# Patient Record
Sex: Female | Born: 1960 | Race: Black or African American | Hispanic: No | Smoking: Former smoker
Health system: Southern US, Community
[De-identification: ages and names within clinical notes are randomized; demographics above are authoritative.]

## PROBLEM LIST (undated history)

## (undated) DIAGNOSIS — F32A Depression, unspecified: Secondary | ICD-10-CM

## (undated) DIAGNOSIS — N289 Disorder of kidney and ureter, unspecified: Secondary | ICD-10-CM

## (undated) DIAGNOSIS — I10 Essential (primary) hypertension: Secondary | ICD-10-CM

## (undated) DIAGNOSIS — F329 Major depressive disorder, single episode, unspecified: Secondary | ICD-10-CM

## (undated) HISTORY — PX: ABDOMINAL HYSTERECTOMY: SHX81

---

## 2004-04-25 ENCOUNTER — Emergency Department (HOSPITAL_COMMUNITY): Admission: EM | Admit: 2004-04-25 | Discharge: 2004-04-26 | Payer: Self-pay | Admitting: *Deleted

## 2004-08-15 ENCOUNTER — Ambulatory Visit: Payer: Self-pay | Admitting: Family Medicine

## 2004-10-08 ENCOUNTER — Ambulatory Visit: Payer: Self-pay | Admitting: *Deleted

## 2004-10-08 ENCOUNTER — Ambulatory Visit: Payer: Self-pay | Admitting: Family Medicine

## 2004-12-03 ENCOUNTER — Ambulatory Visit: Payer: Self-pay | Admitting: Family Medicine

## 2004-12-11 ENCOUNTER — Ambulatory Visit (HOSPITAL_COMMUNITY): Admission: RE | Admit: 2004-12-11 | Discharge: 2004-12-11 | Payer: Self-pay | Admitting: Family Medicine

## 2005-03-05 ENCOUNTER — Ambulatory Visit: Payer: Self-pay | Admitting: Family Medicine

## 2005-03-12 ENCOUNTER — Ambulatory Visit (HOSPITAL_COMMUNITY): Admission: RE | Admit: 2005-03-12 | Discharge: 2005-03-12 | Payer: Self-pay | Admitting: Family Medicine

## 2005-04-19 ENCOUNTER — Ambulatory Visit: Payer: Self-pay | Admitting: Family Medicine

## 2005-05-13 ENCOUNTER — Ambulatory Visit: Payer: Self-pay | Admitting: Family Medicine

## 2006-04-18 IMAGING — CR DG FOOT COMPLETE 3+V*L*
2 series · 2 of 2 positions shown · non-contrast
Comparison: none

CLINICAL DATA: 43-year-old female ? trauma, tripping injury.  
 LEFT FOOT (THREE VIEWS) 04/26/04

[view not recorded (1 of 2)]
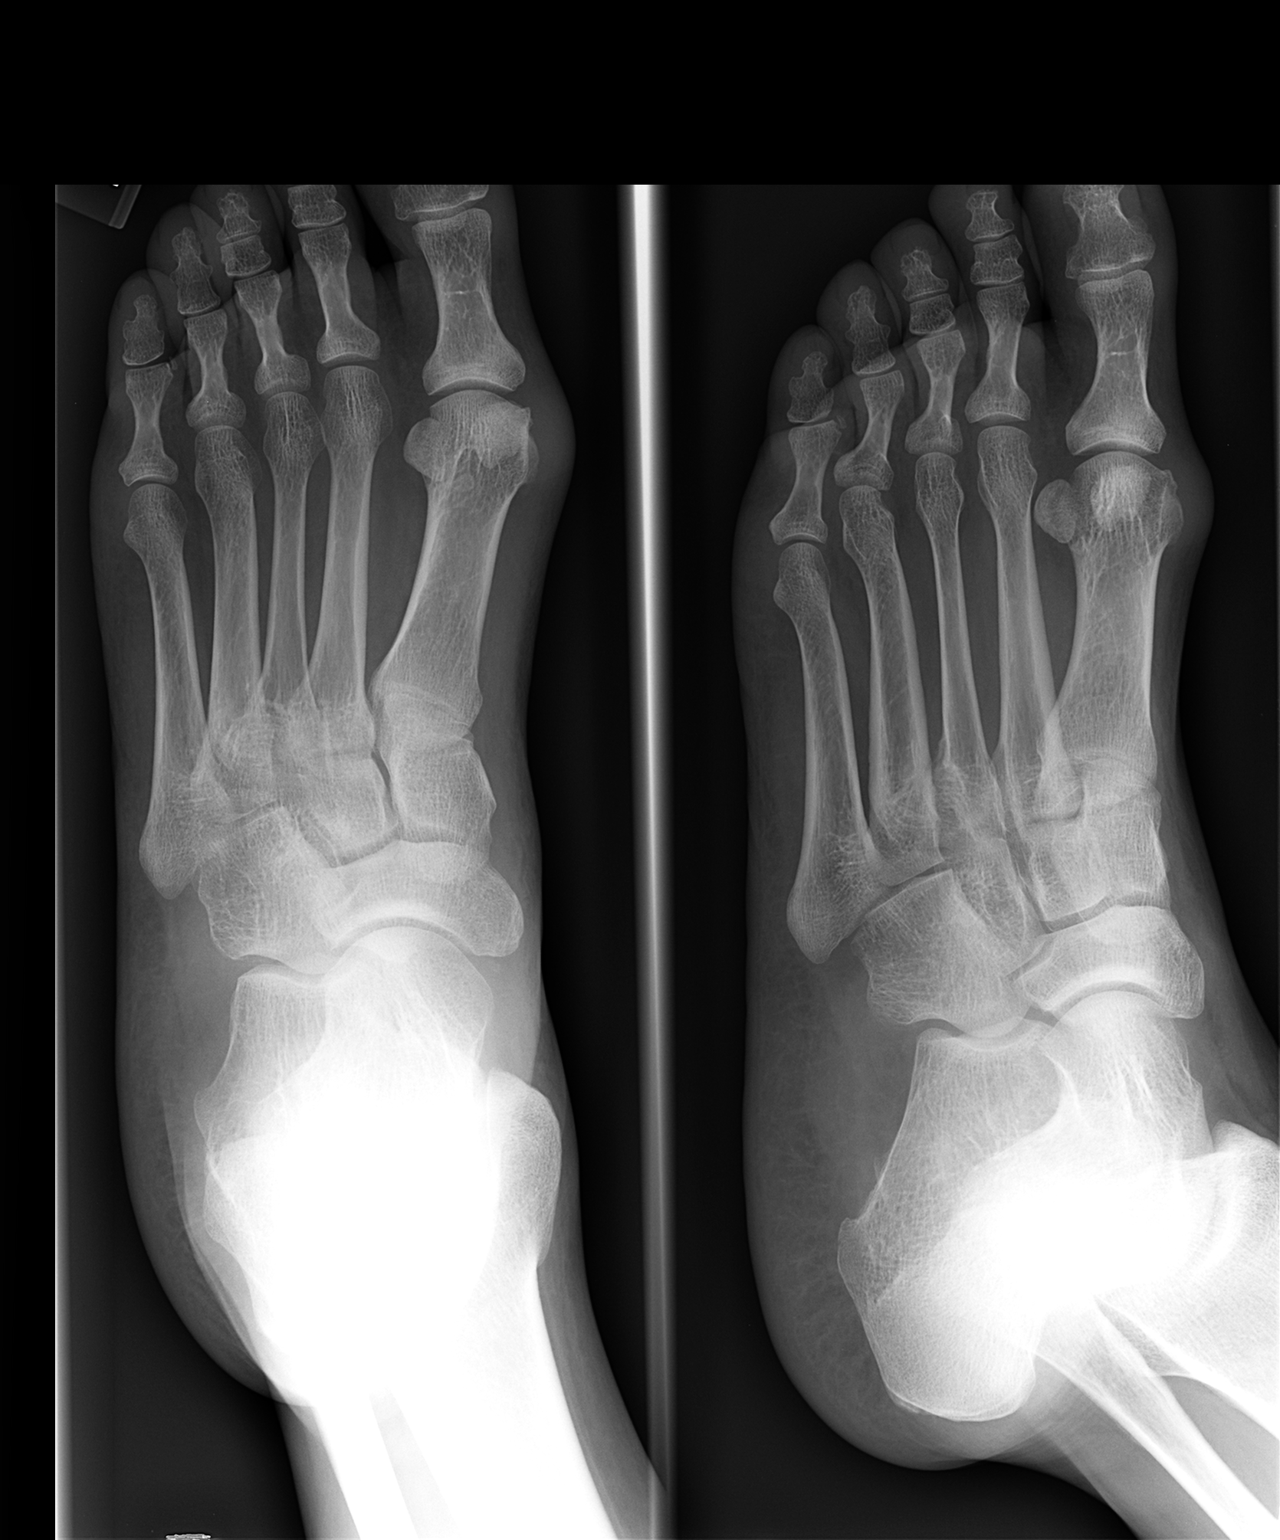

[view not recorded (2 of 2)]
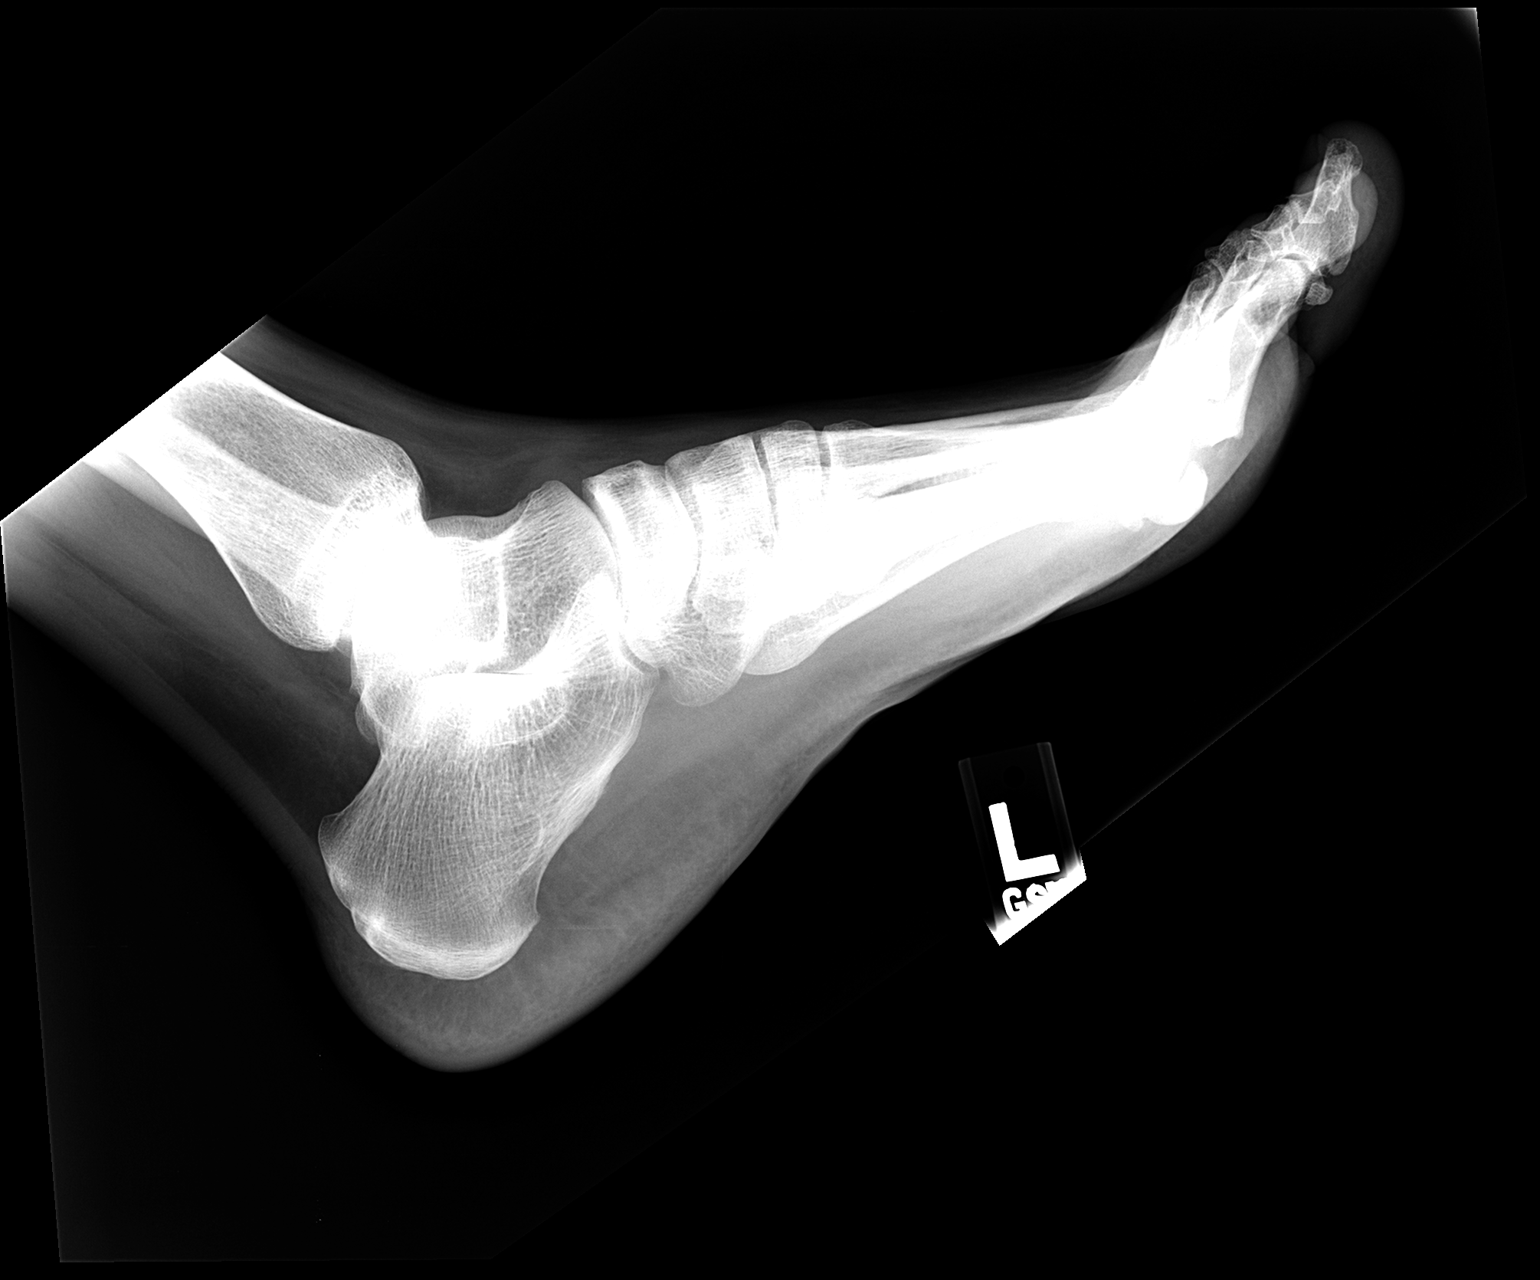

[2 of 2 positions shown; findings below may reference images not displayed]

FINDINGS: Bone density is normal.  No acute fracture or malalignment.  Small secondary ossicle is evident of the left fifth toe interphalangeal joint.  
 IMPRESSION
 No acute finding.

## 2007-03-04 IMAGING — CR DG CHEST 2V
2 series · 2 of 2 positions shown · non-contrast
Comparison: None.

CLINICAL DATA: Smoker of 30 years; tightness in chest; asthma. 
 2-VIEW CHEST:

[view not recorded (1 of 2)]
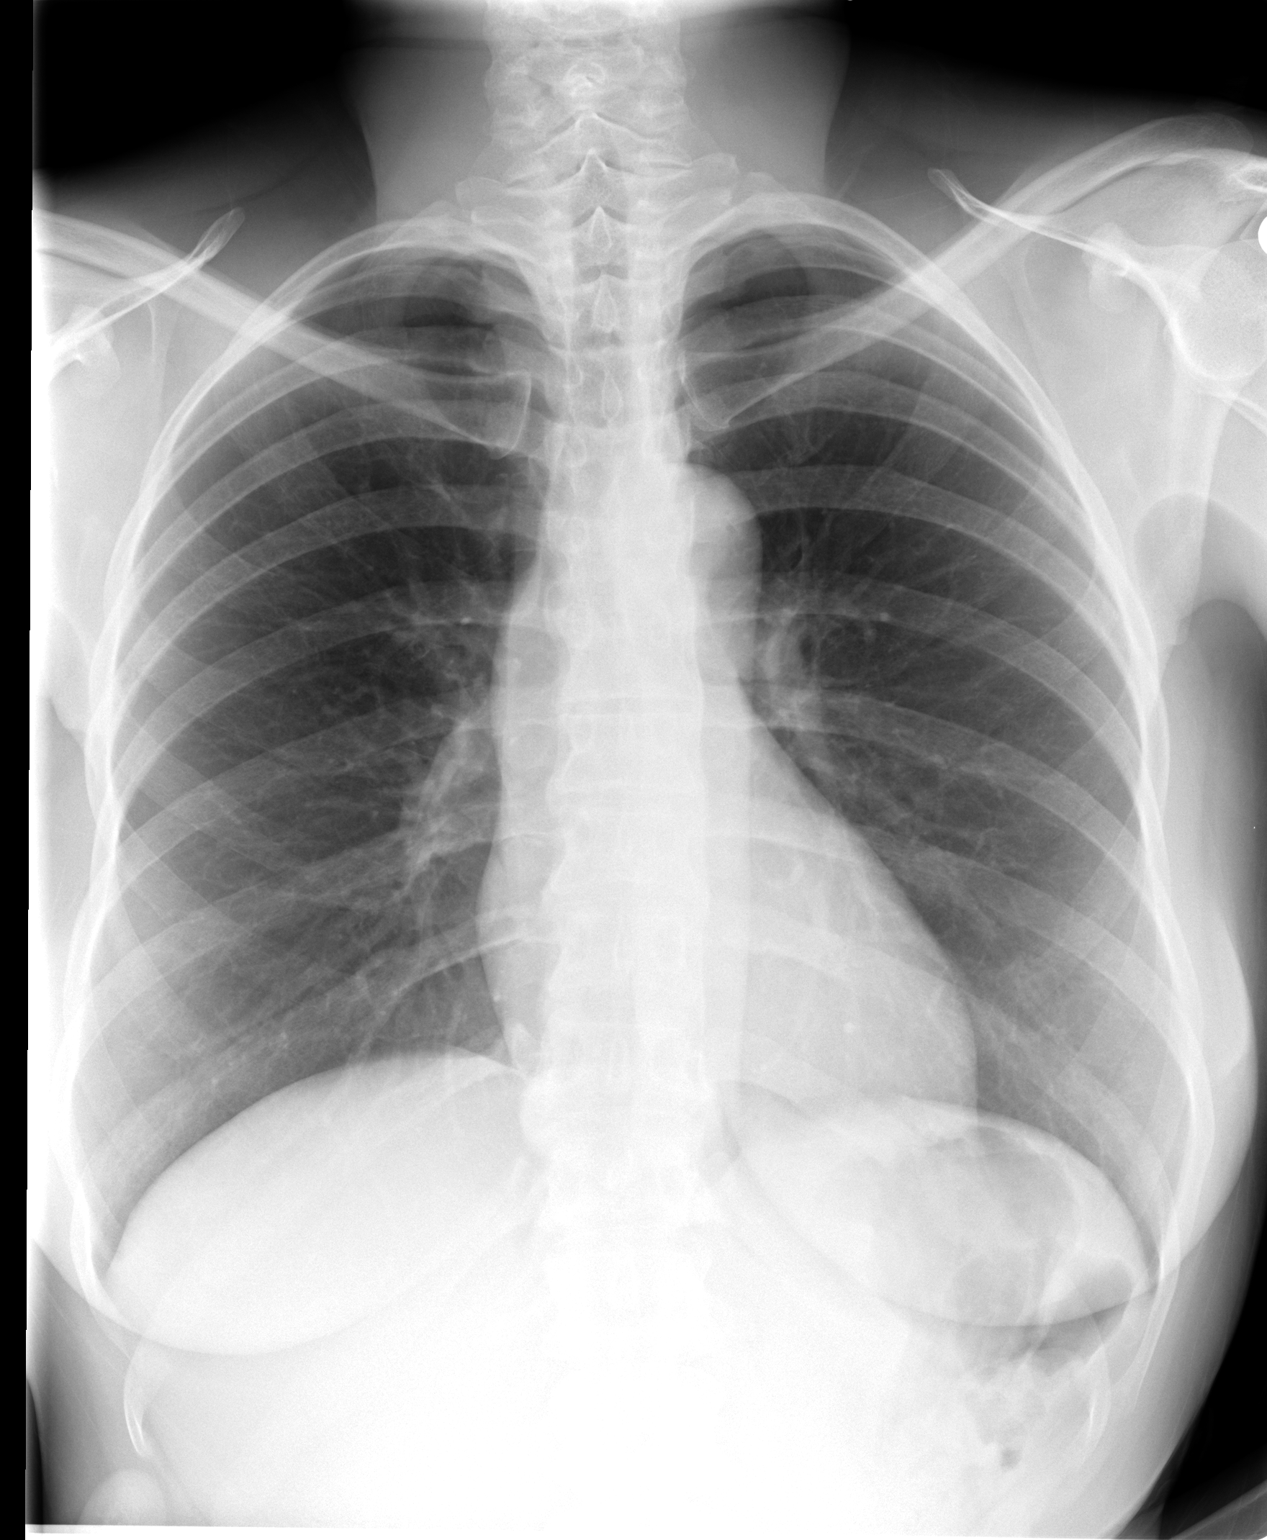

[view not recorded (2 of 2)]
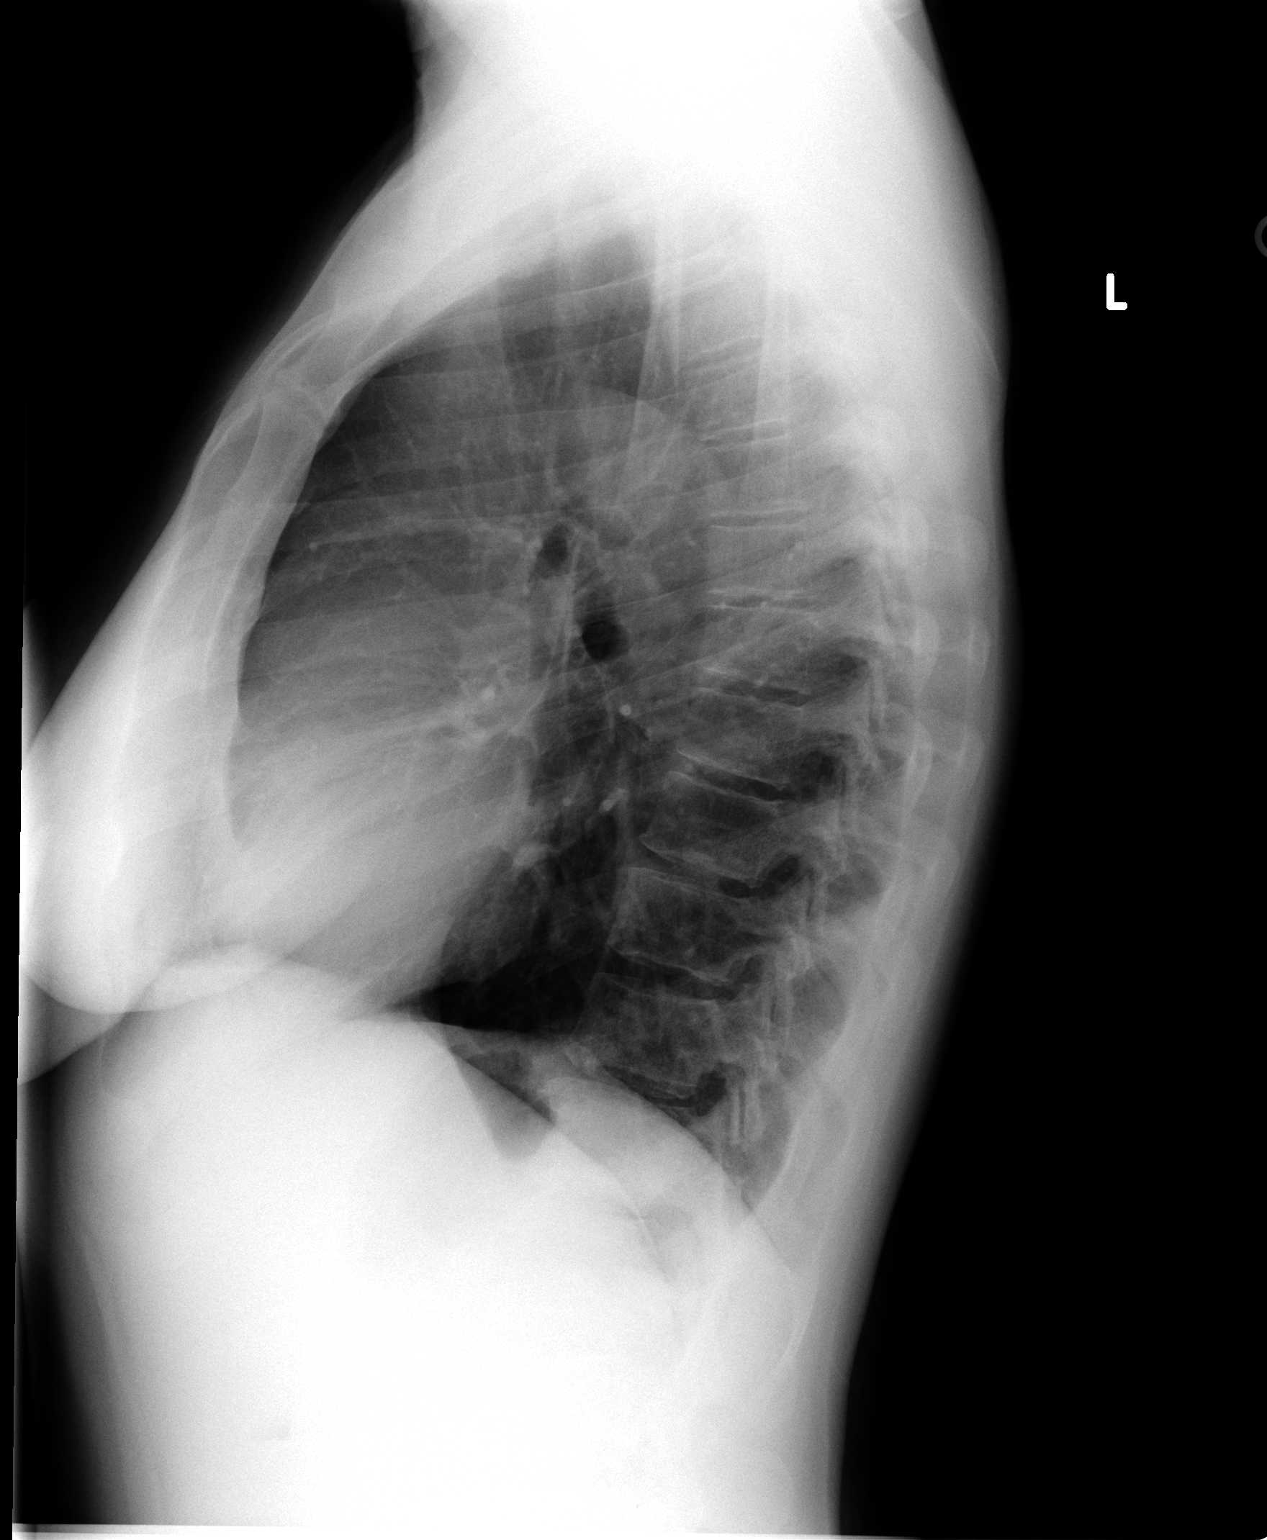

[2 of 2 positions shown; findings below may reference images not displayed]

The cardiac silhouette, mediastinum, and pulmonary vasculature are within normal limits.  Both lungs are clear.  The osseous structures are unremarkable.
IMPRESSION: Normal chest x-ray.

## 2015-07-23 HISTORY — PX: JOINT REPLACEMENT: SHX530

## 2016-12-28 ENCOUNTER — Emergency Department (HOSPITAL_COMMUNITY)
Admission: EM | Admit: 2016-12-28 | Discharge: 2016-12-28 | Disposition: A | Discharge status: Home / Self Care | Payer: Self-pay | Attending: Emergency Medicine | Admitting: Emergency Medicine

## 2016-12-28 ENCOUNTER — Encounter (HOSPITAL_COMMUNITY): Payer: Self-pay

## 2016-12-28 DIAGNOSIS — I951 Orthostatic hypotension: Secondary | ICD-10-CM | POA: Insufficient documentation

## 2016-12-28 DIAGNOSIS — Z79899 Other long term (current) drug therapy: Secondary | ICD-10-CM | POA: Insufficient documentation

## 2016-12-28 HISTORY — DX: Major depressive disorder, single episode, unspecified: F32.9

## 2016-12-28 HISTORY — DX: Essential (primary) hypertension: I10

## 2016-12-28 HISTORY — DX: Disorder of kidney and ureter, unspecified: N28.9

## 2016-12-28 HISTORY — DX: Depression, unspecified: F32.A

## 2016-12-28 LAB — CBC
HCT: 34.8 % — ABNORMAL LOW (ref 36.0–46.0)
Hemoglobin: 10.6 g/dL — ABNORMAL LOW (ref 12.0–15.0)
MCH: 25.9 pg — ABNORMAL LOW (ref 26.0–34.0)
MCHC: 30.5 g/dL (ref 30.0–36.0)
MCV: 84.9 fL (ref 78.0–100.0)
Platelets: 286 10*3/uL (ref 150–400)
RBC: 4.1 MIL/uL (ref 3.87–5.11)
RDW: 15.3 % (ref 11.5–15.5)
WBC: 6.2 10*3/uL (ref 4.0–10.5)

## 2016-12-28 LAB — BASIC METABOLIC PANEL
Anion gap: 6 (ref 5–15)
BUN: 10 mg/dL (ref 6–20)
CO2: 23 mmol/L (ref 22–32)
Calcium: 8.1 mg/dL — ABNORMAL LOW (ref 8.9–10.3)
Chloride: 110 mmol/L (ref 101–111)
Creatinine, Ser: 1.21 mg/dL — ABNORMAL HIGH (ref 0.44–1.00)
GFR calc Af Amer: 56 mL/min — ABNORMAL LOW (ref >60)
GFR calc non Af Amer: 49 mL/min — ABNORMAL LOW (ref >60)
Glucose, Bld: 111 mg/dL — ABNORMAL HIGH (ref 65–99)
Potassium: 3.9 mmol/L (ref 3.5–5.1)
Sodium: 139 mmol/L (ref 135–145)

## 2016-12-28 LAB — CBG MONITORING, ED: Glucose-Capillary: 106 mg/dL — ABNORMAL HIGH (ref 65–99)

## 2016-12-28 LAB — URINALYSIS, ROUTINE W REFLEX MICROSCOPIC
Bilirubin Urine: NEGATIVE
Glucose, UA: NEGATIVE mg/dL
Hgb urine dipstick: NEGATIVE
Ketones, ur: NEGATIVE mg/dL
Leukocytes, UA: NEGATIVE
Nitrite: NEGATIVE
Protein, ur: NEGATIVE mg/dL
Specific Gravity, Urine: 1.004 — ABNORMAL LOW (ref 1.005–1.030)
pH: 6 (ref 5.0–8.0)

## 2016-12-28 MED ORDER — SODIUM CHLORIDE 0.9 % IV SOLN: INTRAVENOUS | Status: DC

## 2016-12-28 MED ORDER — SODIUM CHLORIDE 0.9 % IV BOLUS (SEPSIS)
2000.0000 mL | Freq: Once | INTRAVENOUS | Status: AC
  Administered 2016-12-28: 2000 mL via INTRAVENOUS

## 2016-12-28 NOTE — ED Triage Notes (Addendum)
Patient arrived by [facility] following syncopal episode this am. On EMS arrival patient was alert and oriented but pale and weak, denies pain. No SOB. Did take her daily meds this am. Gabapentin, celebrex, Norvasc, Lopressor. Does report traveling from [place] yesterday and had diarrhea. Skin warm and dry, alert and oriented on arrival. Received 700cc NS PTA. BP systolic 65 when attempted to stand PTA. After initial bolus systolic BP 95 pta.

## 2016-12-28 NOTE — ED Notes (Signed)
ED Provider at bedside. 

## 2016-12-28 NOTE — ED Notes (Signed)
Pt able to ambulate to bathroom without difficulty.  Denies dizziness, with change of position or ambulation.

## 2016-12-28 NOTE — ED Notes (Signed)
Daughter at the bedside

## 2016-12-28 NOTE — ED Notes (Signed)
RN at the bedside 

## 2016-12-28 NOTE — ED Notes (Signed)
Family - Consultation Room A

## 2016-12-28 NOTE — ED Provider Notes (Signed)
[facility]-EMERGENCY DEPT Provider Note   CSN: [id number] Arrival date & time: 12/28/16  1033     History   Chief Complaint Chief Complaint  Patient presents with  . syncope/hypotensive    HPI Tracy Salinas is a 56 y.o. female.  56 year old female here after having a syncopal event while walking at home today. Patient states she became lightheaded and dizzy and passed out. Denies any injury from her fall. EMS was called and when she was sitting down her blood pressure was in the 80s. He tried to stand her up and dropped to the 60s. She was given 500 mL of IV saline and was better. She denies any recent history of volume loss. No headache abdominal chest discomfort. No recent medication changes. No prior shift sign.      Past Medical History:  Diagnosis Date  . Depression   . Hypertension     There are no active problems to display for this patient.   History reviewed. No pertinent surgical history.  OB History    No data available       Home Medications    Prior to Admission medications   Medication Sig Start Date End Date Taking? Authorizing Provider  irbesartan-hydrochlorothiazide (AVALIDE) 300-12.5 MG tablet Take 1 tablet by mouth daily.   Yes [provider]  metoprolol succinate (TOPROL-XL) 25 MG 24 hr tablet Take 25 mg by mouth daily.   Yes [provider]    Family History No family history on file.  Social History Social History  Substance Use Topics  . Smoking status: Never Smoker  . Smokeless tobacco: Never Used  . Alcohol use Not on file     Allergies   Penicillins   Review of Systems Review of Systems  All other systems reviewed and are negative.    Physical Exam Updated Vital Signs BP 93/65 (BP Location: Right Arm)   Pulse (!) 59   Temp (!) 96.8 F (36 C) (Oral)   Resp 20   Ht 1.715 m (5' 7.5")   Wt 96.6 kg (213 lb)   SpO2 100%   BMI 32.87 kg/m   Physical Exam  Constitutional: She is oriented to person,  place, and time. She appears well-developed and well-nourished.  Non-toxic appearance. No distress.  HENT:  Head: Normocephalic and atraumatic.  Eyes: Conjunctivae, EOM and lids are normal. Pupils are equal, round, and reactive to light.  Neck: Normal range of motion. Neck supple. No tracheal deviation present. No thyroid mass present.  Cardiovascular: Normal rate, regular rhythm and normal heart sounds.  Exam reveals no gallop.   No murmur heard. Pulmonary/Chest: Effort normal and breath sounds normal. No stridor. No respiratory distress. She has no decreased breath sounds. She has no wheezes. She has no rhonchi. She has no rales.  Abdominal: Soft. Normal appearance and bowel sounds are normal. She exhibits no distension. There is no tenderness. There is no rebound and no CVA tenderness.  Musculoskeletal: Normal range of motion. She exhibits no edema or tenderness.  Neurological: She is alert and oriented to person, place, and time. She has normal strength. No cranial nerve deficit or sensory deficit. GCS eye subscore is 4. GCS verbal subscore is 5. GCS motor subscore is 6.  Skin: Skin is warm and dry. No abrasion and no rash noted.  Psychiatric: She has a normal mood and affect. Her speech is normal and behavior is normal.  Nursing note and vitals reviewed.    ED Treatments / Results  Labs (all  labs ordered are listed, but only abnormal results are displayed) Labs Reviewed  CBG MONITORING, ED - Abnormal; Notable for the following:       Result Value   Glucose-Capillary 106 (*)    All other components within normal limits  BASIC METABOLIC PANEL  CBC  URINALYSIS, ROUTINE W REFLEX MICROSCOPIC    EKG  EKG Interpretation  Date/Time:  Saturday December 28 2016 10:41:35 EDT Ventricular Rate:  55 PR Interval:    QRS Duration: 91 QT Interval:  439 QTC Calculation: 420 R Axis:   45 Text Interpretation:  Sinus rhythm Confirmed by Geneveive Furness  MD, Leonor Darnell ([id number]) on 12/28/2016 11:12:14 AM        Radiology No results found.  Procedures Procedures (including critical care time)  Medications Ordered in ED Medications  sodium chloride 0.9 % bolus 2,000 mL (not administered)  0.9 %  sodium chloride infusion (not administered)     Initial Impression / Assessment and Plan / ED Course  I have reviewed the triage vital signs and the nursing notes.  Pertinent labs & imaging results that were available during my care of the patient were reviewed by me and considered in my medical decision making (see chart for details).    Patient given IV fluids and feels better here. She is not orthostatic. Suspect volume depletion. She is able to ambulate and denies being dizzy. We'll discharge home     Final Clinical Impressions(s) / ED Diagnoses   Final diagnoses:  None    New Prescriptions New Prescriptions   No medications on file     Lorre NickAllen, Lonald Troiani, MD 12/28/16 1446
